# Patient Record
Sex: Male | Born: 2005 | Race: Black or African American | Hispanic: No | Marital: Single | Smoking: Never smoker
Health system: Southern US, Community
[De-identification: ages and names within clinical notes are randomized; demographics above are authoritative.]

## PROBLEM LIST (undated history)

## (undated) DIAGNOSIS — J45909 Unspecified asthma, uncomplicated: Secondary | ICD-10-CM

## (undated) DIAGNOSIS — F909 Attention-deficit hyperactivity disorder, unspecified type: Secondary | ICD-10-CM

---
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
## 2023-12-24 ENCOUNTER — Emergency Department
Admission: EM | Admit: 2023-12-24 | Discharge: 2023-12-24 | Disposition: A | Discharge status: Home / Self Care | Payer: MEDICAID | Attending: Emergency Medicine | Admitting: Emergency Medicine

## 2023-12-24 ENCOUNTER — Other Ambulatory Visit: Payer: Self-pay

## 2023-12-24 DIAGNOSIS — J069 Acute upper respiratory infection, unspecified: Secondary | ICD-10-CM | POA: Insufficient documentation

## 2023-12-24 DIAGNOSIS — J029 Acute pharyngitis, unspecified: Secondary | ICD-10-CM | POA: Diagnosis present

## 2023-12-24 DIAGNOSIS — J45909 Unspecified asthma, uncomplicated: Secondary | ICD-10-CM | POA: Insufficient documentation

## 2023-12-24 LAB — GROUP A STREP BY PCR: Group A Strep by PCR: NOT DETECTED

## 2023-12-24 MED ORDER — ALBUTEROL SULFATE HFA 108 (90 BASE) MCG/ACT IN AERS: 2.0000 | INHALATION_SPRAY | Freq: Four times a day (QID) | RESPIRATORY_TRACT | 2 refills | Status: AC | PRN

## 2023-12-24 MED ORDER — LIDOCAINE VISCOUS HCL 2 % MT SOLN
15.0000 mL | Freq: Once | OROMUCOSAL | Status: AC
  Administered 2023-12-24: 15 mL via OROMUCOSAL
  Filled 2023-12-24: qty 15

## 2023-12-24 NOTE — Discharge Instructions (Addendum)
 You were seen in the emergency department today for sore throat. Your rapid strep test is negative.   An albuterol  inhaler has been sent to your pharmacy for future use.  A virus may last up to 2-3 weeks.   Take tylenol or ibuprofen  for pain or fever as directed.   Stay hydrated by drinking plenty of fluids to thin mucus. Get adequate amount of sleep and avoid overexertion. Consider a humidifier at night. Warm teas and a spoonful of honey may help reduce cough frequency. Follow up with your primary care provider as needed.   for your sore throat use throat lozenges or Chloraseptic spray.  Gargle with warm salt water several times daily.

## 2023-12-24 NOTE — ED Provider Notes (Signed)
 [facility] Emergency Department Provider Note     Event Date/Time   First MD Initiated Contact with Patient 12/24/23 1554     (approximate)   History   Sore Throat   HPI  Victor Schmidt is a 18 y.o. male with a history of asthma presents to the ED for evaluation of sore throat beginning yesterday.  Associated symptoms includes a mild cough. Denies fever, nausea and vomiting.  Denies difficulty swallowing, chest pain or shortness of breath.  He has tried warm tea with some relief.  No other complaints.  Denies sick contacts.      Physical Exam   Triage Vital Signs: ED Triage Vitals  Encounter Vitals Group     BP 12/24/23 1512 123/83     Systolic BP Percentile --      Diastolic BP Percentile --      Pulse Rate 12/24/23 1512 79     Resp 12/24/23 1512 18     Temp 12/24/23 1512 99.1 F (37.3 C)     Temp Source 12/24/23 1512 Oral     SpO2 12/24/23 1512 100 %     Weight 12/24/23 1511 140 lb (63.5 kg)     Height 12/24/23 1511 5\' 10"  (1.778 m)     Head Circumference --      Peak Flow --      Pain Score 12/24/23 1511 6     Pain Loc --      Pain Education --      Exclude from Growth Chart --     Most recent vital signs: Vitals:   12/24/23 1512  BP: 123/83  Pulse: 79  Resp: 18  Temp: 99.1 F (37.3 C)  SpO2: 100%    General: Alert and oriented. INAD.  Nontoxic appearing Skin:  Warm, dry and intact. No rashes or lesions noted.     Head:  NCAT.  Eyes:  PERRLA. EOMI.  Nose:   Mucosa is moist. No rhinorrhea. Throat: Oropharynx clear.  Mild erythema.  No exudates or tonsil enlargement bilaterally. Uvula is midline. CV:  Good peripheral perfusion. RRR.  RESP:  Normal effort. LCTAB. No retractions.    ED Results / Procedures / Treatments   Labs (all labs ordered are listed, but only abnormal results are displayed) Labs Reviewed  GROUP A STREP BY PCR   No results found.  PROCEDURES:  Critical Care performed:  No  Procedures   MEDICATIONS ORDERED IN ED: Medications  lidocaine  (XYLOCAINE ) 2 % viscous mouth solution 15 mL (has no administration in time range)     IMPRESSION / MDM / ASSESSMENT AND PLAN / ED COURSE  I reviewed the triage vital signs and the nursing notes.                               18 y.o. male presents to the emergency department for evaluation and treatment of acute sore throat. See HPI for further details.   Differential diagnosis includes, but is not limited to viral URI, strep pharyngitis, viral pharyngitis  Patient's presentation is most consistent with acute complicated illness / injury requiring diagnostic workup.  Patient is alert and oriented.  He is hemodynamic stable and afebrile.  Physical exam findings are as stated above.  Rapid strep test is negative.  Do suspect presentation is clinically consistent with viral upper respiratory infection.  Symptomatic care treatment at home provided to the patient who verbalized understanding.  He is requesting a work note which is provided.  Given patient's past medical history of asthma a refill of albuterol  was sent to his pharmacy.  Patient stable condition for discharge home.  ED return precaution discussed.  FINAL CLINICAL IMPRESSION(S) / ED DIAGNOSES   Final diagnoses:  Viral URI with cough     Rx / DC Orders   ED Discharge Orders          Ordered    albuterol  (VENTOLIN  HFA) 108 (90 Base) MCG/ACT inhaler  Every 6 hours PRN        12/24/23 1603           Note:  This document was prepared using Dragon voice recognition software and may include unintentional dictation errors.    Phyllis Breeze, Talynn Lebon A, PA-C 12/24/23 1607    Arline Bennett, MD 12/24/23 2137

## 2023-12-24 NOTE — ED Triage Notes (Signed)
 Pt comes with c/o sore throat that started yesterday. Pt denies any other symptoms.
